# Patient Record
Sex: Male | Born: 1945 | Race: White | Hispanic: No | Marital: Married | Smoking: Current some day smoker
Health system: Southern US, Community
[De-identification: ages and names within clinical notes are randomized; demographics above are authoritative.]

---

## 2003-01-03 ENCOUNTER — Ambulatory Visit (HOSPITAL_COMMUNITY): Admission: RE | Admit: 2003-01-03 | Discharge: 2003-01-03 | Payer: Self-pay | Admitting: Gastroenterology

## 2008-07-08 ENCOUNTER — Encounter: Admission: RE | Admit: 2008-07-08 | Discharge: 2008-07-08 | Payer: Self-pay | Admitting: Family Medicine

## 2011-03-15 NOTE — Op Note (Signed)
NAME:  Brady Castaneda, Brady Castaneda                          ACCOUNT NO.:  [id number]   MEDICAL RECORD NO.:  [id number]                   PATIENT TYPE:  AMB   LOCATION:  ENDO                                 FACILITY:  [facility]   PHYSICIAN:  Anselmo Rod, M.D.               DATE OF BIRTH:  [birth year]   DATE OF PROCEDURE:  01/03/2003  DATE OF DISCHARGE:                                 OPERATIVE REPORT   PROCEDURE PERFORMED:  Screening colonoscopy.   ENDOSCOPIST:  Charna Elizabeth, M.D.   INSTRUMENT USED:  Olympus video colonoscope.   INDICATIONS FOR PROCEDURE:  The patient is a 65 year old white male  undergoing screening colonoscopy to rule out colonic polyps, masses, etc.   PREPROCEDURE PREPARATION:  Informed consent was procured from the patient.  The patient was fasted for eight hours prior to the procedure and prepped  with a bottle of magnesium citrate and a gallon of GoLYTELY the night prior  to the procedure.   PREPROCEDURE PHYSICAL:  The patient had stable vital signs.  Neck supple.  Chest clear to auscultation.  S1 and S2 regular.  Abdomen soft with normal  bowel sounds.   DESCRIPTION OF PROCEDURE:  The patient was placed in left lateral decubitus  position and sedated with 90 mg of Demerol and 8 mg of Versed intravenously.  Once the patient was adequately sedated and maintained on low flow oxygen  and continuous cardiac monitoring, the Olympus video colonoscope was  advanced from the rectum to the cecum and terminal ileum without difficulty.  The patient had a good prep.  There was scattered early diverticular disease  throughout the colon.  No masses, polyps, erosions, ulcerations, etc. were  seen.  Small internal hemorrhoids were seen on retroflexion.  The terminal  ileum appeared normal and without lesions.  The appendicular orifice and  ileocecal valve were clearly visualized and photographed.   IMPRESSION:  1. 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  2.  Small nonbleeding internal hemorrhoids.  3. No masses or polyps seen.  4. Normal-appearing terminal ileum.   RECOMMENDATIONS:  1. A high fiber diet has been discussed with the patient in great detail and     brochures on diverticular disease have been given to him for his     education.  20 to 25 gm of fiber in the diet has been recommended on a     daily basis.  2.     Repeat colorectal cancer screening has been recommended in the next 10 years     unless the patient develops any abnormal symptoms in the interim.  3. Outpatient follow-up on a p.r.n. basis.  Anselmo Rod, M.D.    JNM/MEDQ  D:  01/03/2003  T:  01/03/2003  Job:  [id number]   cc:   Talmadge Coventry, M.D.  [address]  [place]  [place] [address]  Fax: [phone]

## 2014-10-25 ENCOUNTER — Ambulatory Visit (INDEPENDENT_AMBULATORY_CARE_PROVIDER_SITE_OTHER): Payer: Medicare Other | Admitting: Family Medicine

## 2014-10-25 VITALS — BP 136/88 | HR 66 | Temp 98.1°F | Resp 16 | Ht 73.0 in | Wt 278.6 lb

## 2014-10-25 DIAGNOSIS — H109 Unspecified conjunctivitis: Secondary | ICD-10-CM | POA: Diagnosis not present

## 2014-10-25 DIAGNOSIS — J069 Acute upper respiratory infection, unspecified: Secondary | ICD-10-CM

## 2014-10-25 MED ORDER — CIPROFLOXACIN HCL 0.3 % OP SOLN: OPHTHALMIC | Status: AC

## 2014-10-25 MED ORDER — AZELASTINE HCL 0.05 % OP SOLN: 1.0000 [drp] | Freq: Two times a day (BID) | OPHTHALMIC | Status: AC | PRN

## 2014-10-25 NOTE — Progress Notes (Signed)
Subjective:  This chart was scribed for Merri Ray, MD by Tula Nakayama, ED Scribe. This patient was seen at [facility] and the patient's care was started at 2:34 PM   Patient ID: Brady Castaneda, male    DOB: August 25, 1946, 68 y.o.   MRN: [id number]  HPI  Brady Castaneda is a 68 y.o. male No primary care provider on file.  HPI Comments: Brady Castaneda is a 68 y.o. male who presents to [facility] complaining of constant congestion, rhinorrhea and cough that started 3 days ago. He states erythema, watering and crusting of both eyes that started 2 days ago as associated symptoms. He reports symptoms are worse on the right side. Pt notes that eyes feel itchy and grainy, but are not painful. He has tried Systane with no relief. Pt states that discharge is worse at night. He denies a history of allergies and recent eye injuries. Pt also denies visual disturbances and fever as associated symptoms.  Review of Systems  Constitutional: Negative for fever.  HENT: Positive for congestion and rhinorrhea.   Eyes: Positive for redness and itching. Negative for visual disturbance.  Respiratory: Positive for cough.   All other systems reviewed and are negative.     Objective:   Physical Exam  Constitutional: He is oriented to person, place, and time. He appears well-developed and well-nourished.  HENT:  Head: Normocephalic and atraumatic.  Right Ear: Tympanic membrane, external ear and ear canal normal.  Left Ear: Tympanic membrane, external ear and ear canal normal.  Nose: No rhinorrhea.  Mouth/Throat: Oropharynx is clear and moist and mucous membranes are normal. No oropharyngeal exudate or posterior oropharyngeal erythema.  Eyes: Conjunctivae and EOM are normal. Pupils are equal, round, and reactive to light. Right eye exhibits no discharge. Left eye exhibits no discharge.  PERRL; EOMI; anterior chambers are clear; diffuse scleral injection bilaterally; right greater than left; no crusting or discharge  at canthi; no preauricular lymphadenopathy; no other lymphadenopathy  Neck: Neck supple.  Cardiovascular: Normal rate, regular rhythm, normal heart sounds and intact distal pulses.   No murmur heard. Pulmonary/Chest: Effort normal and breath sounds normal. He has no wheezes. He has no rhonchi. He has no rales.  Abdominal: Soft. There is no tenderness.  Lymphadenopathy:    He has no cervical adenopathy.  Neurological: He is alert and oriented to person, place, and time.  Skin: Skin is warm and dry. No rash noted.  Psychiatric: He has a normal mood and affect. His behavior is normal.  Nursing note and vitals reviewed.  Filed Vitals:   10/25/14 1323  BP: 136/88  Pulse: 66  Temp: 98.1 F (36.7 C)  TempSrc: Oral  Resp: 16  Height: 6\' 1"  (1.854 m)  Weight: 278 lb 9.6 oz (126.372 kg)  SpO2: 97%    Visual Acuity Screening   Right eye Left eye Both eyes  Without correction: 20/20 20/20 20/20   With correction:         Assessment & Plan:   Brady Castaneda is a 68 y.o. male Bilateral conjunctivitis - Plan: azelastine (OPTIVAR) 0.05 % ophthalmic solution  Acute upper respiratory infection - Plan: ciprofloxacin (CILOXAN) 0.3 % ophthalmic solution  Suspected viral conjunctivitis, with underlying URI. May be allergic component with bilateral nature and itching - trial of Optivar gtts. If not improving, or daytime discharge/crusting - can fill antibiotic gtts. rtc precautions discussed - especially if any change in visual acuity.   Meds ordered this encounter  Medications  . azelastine (  OPTIVAR) 0.05 % ophthalmic solution    Sig: Place 1 drop into both eyes 2 (two) times daily as needed.    Dispense:  6 mL    Refill:  1  . ciprofloxacin (CILOXAN) 0.3 % ophthalmic solution    Sig: Administer 1 drop to affected eye every 2 hours, while awake, for 2 days. Then 1 drop, every 4 hours, while awake, for the next 5 days.    Dispense:  10 mL    Refill:  0   Patient Instructions  Your "pink  eye" is likely due to the cold virus, but with itching symptoms - can try prescription eye drop to see if this helps. If crusting or discharge noted during the day, can treat for bacteria with printed prescription. Return to the clinic or go to the nearest emergency room if any of your symptoms worsen or new symptoms occur.    Conjunctivitis Conjunctivitis is commonly called "pink eye." Conjunctivitis can be caused by bacterial or viral infection, allergies, or injuries. There is usually redness of the lining of the eye, itching, discomfort, and sometimes discharge. There may be deposits of matter along the eyelids. A viral infection usually causes a watery discharge, while a bacterial infection causes a yellowish, thick discharge. Pink eye is very contagious and spreads by direct contact. You may be given antibiotic eyedrops as part of your treatment. Before using your eye medicine, remove all drainage from the eye by washing gently with warm water and cotton balls. Continue to use the medication until you have awakened 2 mornings in a row without discharge from the eye. Do not rub your eye. This increases the irritation and helps spread infection. Use separate towels from other household members. Wash your hands with soap and water before and after touching your eyes. Use cold compresses to reduce pain and sunglasses to relieve irritation from light. Do not wear contact lenses or wear eye makeup until the infection is gone. SEEK MEDICAL CARE IF:   Your symptoms are not better after 3 days of treatment.  You have increased pain or trouble seeing.  The outer eyelids become very red or swollen. Document Released: 11/21/2004 Document Revised: 01/06/2012 Document Reviewed: 10/14/2005 [facility] Patient Information 2015 [place], [place]. This information is not intended to replace advice given to you by your health care provider. Make sure you discuss any questions you have with your health care  provider.     I personally performed the services described in this documentation, which was scribed in my presence. The recorded information has been reviewed and considered, and addended by me as needed.

## 2014-10-25 NOTE — Patient Instructions (Signed)
Your "pink eye" is likely due to the cold virus, but with itching symptoms - can try prescription eye drop to see if this helps. If crusting or discharge noted during the day, can treat for bacteria with printed prescription. Return to the clinic or go to the nearest emergency room if any of your symptoms worsen or new symptoms occur.    Conjunctivitis Conjunctivitis is commonly called "pink eye." Conjunctivitis can be caused by bacterial or viral infection, allergies, or injuries. There is usually redness of the lining of the eye, itching, discomfort, and sometimes discharge. There may be deposits of matter along the eyelids. A viral infection usually causes a watery discharge, while a bacterial infection causes a yellowish, thick discharge. Pink eye is very contagious and spreads by direct contact. You may be given antibiotic eyedrops as part of your treatment. Before using your eye medicine, remove all drainage from the eye by washing gently with warm water and cotton balls. Continue to use the medication until you have awakened 2 mornings in a row without discharge from the eye. Do not rub your eye. This increases the irritation and helps spread infection. Use separate towels from other household members. Wash your hands with soap and water before and after touching your eyes. Use cold compresses to reduce pain and sunglasses to relieve irritation from light. Do not wear contact lenses or wear eye makeup until the infection is gone. SEEK MEDICAL CARE IF:   Your symptoms are not better after 3 days of treatment.  You have increased pain or trouble seeing.  The outer eyelids become very red or swollen. Document Released: 11/21/2004 Document Revised: 01/06/2012 Document Reviewed: 10/14/2005 [facility] Patient Information 2015 [place], [place]. This information is not intended to replace advice given to you by your health care provider. Make sure you discuss any questions you have with your health care  provider.

## 2015-12-25 DIAGNOSIS — I10 Essential (primary) hypertension: Secondary | ICD-10-CM | POA: Diagnosis not present

## 2015-12-25 DIAGNOSIS — Z131 Encounter for screening for diabetes mellitus: Secondary | ICD-10-CM | POA: Diagnosis not present

## 2015-12-25 DIAGNOSIS — Z125 Encounter for screening for malignant neoplasm of prostate: Secondary | ICD-10-CM | POA: Diagnosis not present

## 2015-12-25 DIAGNOSIS — Z13220 Encounter for screening for lipoid disorders: Secondary | ICD-10-CM | POA: Diagnosis not present

## 2015-12-25 DIAGNOSIS — R197 Diarrhea, unspecified: Secondary | ICD-10-CM | POA: Diagnosis not present

## 2015-12-25 DIAGNOSIS — E669 Obesity, unspecified: Secondary | ICD-10-CM | POA: Diagnosis not present

## 2015-12-25 DIAGNOSIS — Z23 Encounter for immunization: Secondary | ICD-10-CM | POA: Diagnosis not present

## 2015-12-25 DIAGNOSIS — Z1211 Encounter for screening for malignant neoplasm of colon: Secondary | ICD-10-CM | POA: Diagnosis not present

## 2015-12-26 DIAGNOSIS — Z23 Encounter for immunization: Secondary | ICD-10-CM | POA: Diagnosis not present

## 2015-12-26 DIAGNOSIS — Z136 Encounter for screening for cardiovascular disorders: Secondary | ICD-10-CM | POA: Diagnosis not present

## 2015-12-26 DIAGNOSIS — Z131 Encounter for screening for diabetes mellitus: Secondary | ICD-10-CM | POA: Diagnosis not present

## 2015-12-26 DIAGNOSIS — I10 Essential (primary) hypertension: Secondary | ICD-10-CM | POA: Diagnosis not present

## 2015-12-26 DIAGNOSIS — Z125 Encounter for screening for malignant neoplasm of prostate: Secondary | ICD-10-CM | POA: Diagnosis not present

## 2015-12-26 DIAGNOSIS — E669 Obesity, unspecified: Secondary | ICD-10-CM | POA: Diagnosis not present

## 2016-01-04 DIAGNOSIS — K439 Ventral hernia without obstruction or gangrene: Secondary | ICD-10-CM | POA: Diagnosis not present

## 2016-01-04 DIAGNOSIS — Z1211 Encounter for screening for malignant neoplasm of colon: Secondary | ICD-10-CM | POA: Diagnosis not present

## 2016-01-04 DIAGNOSIS — K5730 Diverticulosis of large intestine without perforation or abscess without bleeding: Secondary | ICD-10-CM | POA: Diagnosis not present

## 2016-01-08 DIAGNOSIS — I10 Essential (primary) hypertension: Secondary | ICD-10-CM | POA: Diagnosis not present

## 2016-01-08 DIAGNOSIS — F430 Acute stress reaction: Secondary | ICD-10-CM | POA: Diagnosis not present

## 2016-01-08 DIAGNOSIS — E669 Obesity, unspecified: Secondary | ICD-10-CM | POA: Diagnosis not present

## 2016-02-05 DIAGNOSIS — I10 Essential (primary) hypertension: Secondary | ICD-10-CM | POA: Diagnosis not present

## 2016-02-05 DIAGNOSIS — E669 Obesity, unspecified: Secondary | ICD-10-CM | POA: Diagnosis not present

## 2016-02-05 DIAGNOSIS — F430 Acute stress reaction: Secondary | ICD-10-CM | POA: Diagnosis not present

## 2016-02-23 DIAGNOSIS — H6122 Impacted cerumen, left ear: Secondary | ICD-10-CM | POA: Diagnosis not present

## 2016-02-23 DIAGNOSIS — E669 Obesity, unspecified: Secondary | ICD-10-CM | POA: Diagnosis not present

## 2016-02-23 DIAGNOSIS — I10 Essential (primary) hypertension: Secondary | ICD-10-CM | POA: Diagnosis not present

## 2016-02-23 DIAGNOSIS — E7800 Pure hypercholesterolemia, unspecified: Secondary | ICD-10-CM | POA: Diagnosis not present

## 2016-06-07 DIAGNOSIS — K621 Rectal polyp: Secondary | ICD-10-CM | POA: Diagnosis not present

## 2016-06-07 DIAGNOSIS — D128 Benign neoplasm of rectum: Secondary | ICD-10-CM | POA: Diagnosis not present

## 2016-06-07 DIAGNOSIS — D123 Benign neoplasm of transverse colon: Secondary | ICD-10-CM | POA: Diagnosis not present

## 2016-06-07 DIAGNOSIS — Z1211 Encounter for screening for malignant neoplasm of colon: Secondary | ICD-10-CM | POA: Diagnosis not present

## 2016-06-07 DIAGNOSIS — K635 Polyp of colon: Secondary | ICD-10-CM | POA: Diagnosis not present

## 2016-06-27 DIAGNOSIS — E7800 Pure hypercholesterolemia, unspecified: Secondary | ICD-10-CM | POA: Diagnosis not present

## 2016-06-27 DIAGNOSIS — R739 Hyperglycemia, unspecified: Secondary | ICD-10-CM | POA: Diagnosis not present

## 2016-06-27 DIAGNOSIS — I10 Essential (primary) hypertension: Secondary | ICD-10-CM | POA: Diagnosis not present

## 2016-06-27 DIAGNOSIS — Z6836 Body mass index (BMI) 36.0-36.9, adult: Secondary | ICD-10-CM | POA: Diagnosis not present

## 2016-06-27 DIAGNOSIS — E669 Obesity, unspecified: Secondary | ICD-10-CM | POA: Diagnosis not present

## 2016-08-27 DIAGNOSIS — E669 Obesity, unspecified: Secondary | ICD-10-CM | POA: Diagnosis not present

## 2016-08-27 DIAGNOSIS — E7800 Pure hypercholesterolemia, unspecified: Secondary | ICD-10-CM | POA: Diagnosis not present

## 2016-08-27 DIAGNOSIS — R739 Hyperglycemia, unspecified: Secondary | ICD-10-CM | POA: Diagnosis not present

## 2016-08-27 DIAGNOSIS — I10 Essential (primary) hypertension: Secondary | ICD-10-CM | POA: Diagnosis not present

## 2016-08-27 DIAGNOSIS — Z6837 Body mass index (BMI) 37.0-37.9, adult: Secondary | ICD-10-CM | POA: Diagnosis not present

## 2016-10-29 DIAGNOSIS — R739 Hyperglycemia, unspecified: Secondary | ICD-10-CM | POA: Diagnosis not present

## 2016-10-29 DIAGNOSIS — I10 Essential (primary) hypertension: Secondary | ICD-10-CM | POA: Diagnosis not present

## 2016-10-29 DIAGNOSIS — Z6837 Body mass index (BMI) 37.0-37.9, adult: Secondary | ICD-10-CM | POA: Diagnosis not present

## 2016-10-29 DIAGNOSIS — E7800 Pure hypercholesterolemia, unspecified: Secondary | ICD-10-CM | POA: Diagnosis not present

## 2016-10-29 DIAGNOSIS — E669 Obesity, unspecified: Secondary | ICD-10-CM | POA: Diagnosis not present

## 2016-12-10 DIAGNOSIS — I10 Essential (primary) hypertension: Secondary | ICD-10-CM | POA: Diagnosis not present

## 2016-12-10 DIAGNOSIS — E669 Obesity, unspecified: Secondary | ICD-10-CM | POA: Diagnosis not present

## 2016-12-10 DIAGNOSIS — R739 Hyperglycemia, unspecified: Secondary | ICD-10-CM | POA: Diagnosis not present

## 2016-12-10 DIAGNOSIS — L989 Disorder of the skin and subcutaneous tissue, unspecified: Secondary | ICD-10-CM | POA: Diagnosis not present

## 2016-12-10 DIAGNOSIS — Z6836 Body mass index (BMI) 36.0-36.9, adult: Secondary | ICD-10-CM | POA: Diagnosis not present

## 2016-12-10 DIAGNOSIS — E7800 Pure hypercholesterolemia, unspecified: Secondary | ICD-10-CM | POA: Diagnosis not present

## 2017-01-23 DIAGNOSIS — E7800 Pure hypercholesterolemia, unspecified: Secondary | ICD-10-CM | POA: Diagnosis not present

## 2017-01-23 DIAGNOSIS — R740 Nonspecific elevation of levels of transaminase and lactic acid dehydrogenase [LDH]: Secondary | ICD-10-CM | POA: Diagnosis not present

## 2017-01-23 DIAGNOSIS — E669 Obesity, unspecified: Secondary | ICD-10-CM | POA: Diagnosis not present

## 2017-01-23 DIAGNOSIS — Z6836 Body mass index (BMI) 36.0-36.9, adult: Secondary | ICD-10-CM | POA: Diagnosis not present

## 2017-01-23 DIAGNOSIS — I10 Essential (primary) hypertension: Secondary | ICD-10-CM | POA: Diagnosis not present

## 2017-01-23 DIAGNOSIS — R739 Hyperglycemia, unspecified: Secondary | ICD-10-CM | POA: Diagnosis not present

## 2017-02-03 ENCOUNTER — Other Ambulatory Visit: Payer: Self-pay | Admitting: Family Medicine

## 2017-02-03 DIAGNOSIS — Z6837 Body mass index (BMI) 37.0-37.9, adult: Secondary | ICD-10-CM | POA: Diagnosis not present

## 2017-02-03 DIAGNOSIS — E669 Obesity, unspecified: Secondary | ICD-10-CM | POA: Diagnosis not present

## 2017-02-03 DIAGNOSIS — R718 Other abnormality of red blood cells: Secondary | ICD-10-CM | POA: Diagnosis not present

## 2017-02-03 DIAGNOSIS — R740 Nonspecific elevation of levels of transaminase and lactic acid dehydrogenase [LDH]: Principal | ICD-10-CM

## 2017-02-03 DIAGNOSIS — R899 Unspecified abnormal finding in specimens from other organs, systems and tissues: Secondary | ICD-10-CM | POA: Diagnosis not present

## 2017-02-03 DIAGNOSIS — R7401 Elevation of levels of liver transaminase levels: Secondary | ICD-10-CM

## 2017-02-05 ENCOUNTER — Other Ambulatory Visit: Payer: Self-pay | Admitting: Family Medicine

## 2017-02-05 DIAGNOSIS — R7401 Elevation of levels of liver transaminase levels: Secondary | ICD-10-CM

## 2017-02-05 DIAGNOSIS — R740 Nonspecific elevation of levels of transaminase and lactic acid dehydrogenase [LDH]: Principal | ICD-10-CM

## 2017-02-10 ENCOUNTER — Ambulatory Visit
Admission: RE | Admit: 2017-02-10 | Discharge: 2017-02-10 | Disposition: A | Discharge status: Home / Self Care | Payer: Medicare Other | Source: Ambulatory Visit | Attending: Family Medicine | Admitting: Family Medicine

## 2017-02-10 DIAGNOSIS — R740 Nonspecific elevation of levels of transaminase and lactic acid dehydrogenase [LDH]: Principal | ICD-10-CM

## 2017-02-10 DIAGNOSIS — R7401 Elevation of levels of liver transaminase levels: Secondary | ICD-10-CM

## 2017-02-10 DIAGNOSIS — K760 Fatty (change of) liver, not elsewhere classified: Secondary | ICD-10-CM | POA: Diagnosis not present

## 2017-03-05 DIAGNOSIS — M25561 Pain in right knee: Secondary | ICD-10-CM | POA: Diagnosis not present

## 2017-03-10 DIAGNOSIS — M25561 Pain in right knee: Secondary | ICD-10-CM | POA: Diagnosis not present

## 2017-03-28 DIAGNOSIS — L237 Allergic contact dermatitis due to plants, except food: Secondary | ICD-10-CM | POA: Diagnosis not present

## 2017-03-31 DIAGNOSIS — E7800 Pure hypercholesterolemia, unspecified: Secondary | ICD-10-CM | POA: Diagnosis not present

## 2017-03-31 DIAGNOSIS — I10 Essential (primary) hypertension: Secondary | ICD-10-CM | POA: Diagnosis not present

## 2017-03-31 DIAGNOSIS — K760 Fatty (change of) liver, not elsewhere classified: Secondary | ICD-10-CM | POA: Diagnosis not present

## 2017-03-31 DIAGNOSIS — R739 Hyperglycemia, unspecified: Secondary | ICD-10-CM | POA: Diagnosis not present

## 2017-03-31 DIAGNOSIS — R740 Nonspecific elevation of levels of transaminase and lactic acid dehydrogenase [LDH]: Secondary | ICD-10-CM | POA: Diagnosis not present

## 2017-03-31 DIAGNOSIS — E669 Obesity, unspecified: Secondary | ICD-10-CM | POA: Diagnosis not present

## 2017-03-31 DIAGNOSIS — Z6836 Body mass index (BMI) 36.0-36.9, adult: Secondary | ICD-10-CM | POA: Diagnosis not present

## 2017-07-21 DIAGNOSIS — M1711 Unilateral primary osteoarthritis, right knee: Secondary | ICD-10-CM | POA: Diagnosis not present

## 2017-07-21 DIAGNOSIS — Z6837 Body mass index (BMI) 37.0-37.9, adult: Secondary | ICD-10-CM | POA: Diagnosis not present

## 2017-07-21 DIAGNOSIS — I10 Essential (primary) hypertension: Secondary | ICD-10-CM | POA: Diagnosis not present

## 2017-07-21 DIAGNOSIS — Z23 Encounter for immunization: Secondary | ICD-10-CM | POA: Diagnosis not present

## 2017-07-21 DIAGNOSIS — E669 Obesity, unspecified: Secondary | ICD-10-CM | POA: Diagnosis not present

## 2017-07-21 DIAGNOSIS — R740 Nonspecific elevation of levels of transaminase and lactic acid dehydrogenase [LDH]: Secondary | ICD-10-CM | POA: Diagnosis not present

## 2017-07-21 DIAGNOSIS — K760 Fatty (change of) liver, not elsewhere classified: Secondary | ICD-10-CM | POA: Diagnosis not present

## 2017-07-21 DIAGNOSIS — E7800 Pure hypercholesterolemia, unspecified: Secondary | ICD-10-CM | POA: Diagnosis not present

## 2017-12-08 DIAGNOSIS — R739 Hyperglycemia, unspecified: Secondary | ICD-10-CM | POA: Diagnosis not present

## 2017-12-08 DIAGNOSIS — R740 Nonspecific elevation of levels of transaminase and lactic acid dehydrogenase [LDH]: Secondary | ICD-10-CM | POA: Diagnosis not present

## 2017-12-08 DIAGNOSIS — K760 Fatty (change of) liver, not elsewhere classified: Secondary | ICD-10-CM | POA: Diagnosis not present

## 2017-12-08 DIAGNOSIS — Z23 Encounter for immunization: Secondary | ICD-10-CM | POA: Diagnosis not present

## 2017-12-08 DIAGNOSIS — E7800 Pure hypercholesterolemia, unspecified: Secondary | ICD-10-CM | POA: Diagnosis not present

## 2017-12-08 DIAGNOSIS — I10 Essential (primary) hypertension: Secondary | ICD-10-CM | POA: Diagnosis not present

## 2017-12-08 DIAGNOSIS — Z6837 Body mass index (BMI) 37.0-37.9, adult: Secondary | ICD-10-CM | POA: Diagnosis not present

## 2017-12-08 DIAGNOSIS — E669 Obesity, unspecified: Secondary | ICD-10-CM | POA: Diagnosis not present

## 2017-12-11 DIAGNOSIS — E7800 Pure hypercholesterolemia, unspecified: Secondary | ICD-10-CM | POA: Diagnosis not present

## 2017-12-11 DIAGNOSIS — Z6837 Body mass index (BMI) 37.0-37.9, adult: Secondary | ICD-10-CM | POA: Diagnosis not present

## 2017-12-11 DIAGNOSIS — I10 Essential (primary) hypertension: Secondary | ICD-10-CM | POA: Diagnosis not present

## 2017-12-11 DIAGNOSIS — K760 Fatty (change of) liver, not elsewhere classified: Secondary | ICD-10-CM | POA: Diagnosis not present

## 2017-12-11 DIAGNOSIS — R739 Hyperglycemia, unspecified: Secondary | ICD-10-CM | POA: Diagnosis not present

## 2017-12-11 DIAGNOSIS — R740 Nonspecific elevation of levels of transaminase and lactic acid dehydrogenase [LDH]: Secondary | ICD-10-CM | POA: Diagnosis not present

## 2017-12-11 DIAGNOSIS — E669 Obesity, unspecified: Secondary | ICD-10-CM | POA: Diagnosis not present

## 2018-02-19 DIAGNOSIS — I10 Essential (primary) hypertension: Secondary | ICD-10-CM | POA: Diagnosis not present

## 2018-02-19 DIAGNOSIS — R739 Hyperglycemia, unspecified: Secondary | ICD-10-CM | POA: Diagnosis not present

## 2018-02-19 DIAGNOSIS — K760 Fatty (change of) liver, not elsewhere classified: Secondary | ICD-10-CM | POA: Diagnosis not present

## 2018-02-19 DIAGNOSIS — Z6836 Body mass index (BMI) 36.0-36.9, adult: Secondary | ICD-10-CM | POA: Diagnosis not present

## 2018-02-19 DIAGNOSIS — R740 Nonspecific elevation of levels of transaminase and lactic acid dehydrogenase [LDH]: Secondary | ICD-10-CM | POA: Diagnosis not present

## 2018-02-19 DIAGNOSIS — E669 Obesity, unspecified: Secondary | ICD-10-CM | POA: Diagnosis not present

## 2018-02-19 DIAGNOSIS — E7800 Pure hypercholesterolemia, unspecified: Secondary | ICD-10-CM | POA: Diagnosis not present

## 2018-04-09 DIAGNOSIS — R739 Hyperglycemia, unspecified: Secondary | ICD-10-CM | POA: Diagnosis not present

## 2018-04-09 DIAGNOSIS — E7800 Pure hypercholesterolemia, unspecified: Secondary | ICD-10-CM | POA: Diagnosis not present

## 2018-04-09 DIAGNOSIS — E669 Obesity, unspecified: Secondary | ICD-10-CM | POA: Diagnosis not present

## 2018-04-09 DIAGNOSIS — K760 Fatty (change of) liver, not elsewhere classified: Secondary | ICD-10-CM | POA: Diagnosis not present

## 2018-04-09 DIAGNOSIS — R740 Nonspecific elevation of levels of transaminase and lactic acid dehydrogenase [LDH]: Secondary | ICD-10-CM | POA: Diagnosis not present

## 2018-04-09 DIAGNOSIS — I10 Essential (primary) hypertension: Secondary | ICD-10-CM | POA: Diagnosis not present

## 2018-07-01 IMAGING — US US ABDOMEN LIMITED
1 series · 14 of 25 positions shown · non-contrast
Comparison: Ultrasound 07/08/2008

CLINICAL DATA: Transaminitis

EXAM:
US ABDOMEN LIMITED - RIGHT UPPER QUADRANT

[Series 1: us abdomen limited · 0.28mm/px · 14 of 45 slices shown]
[im 1/45]
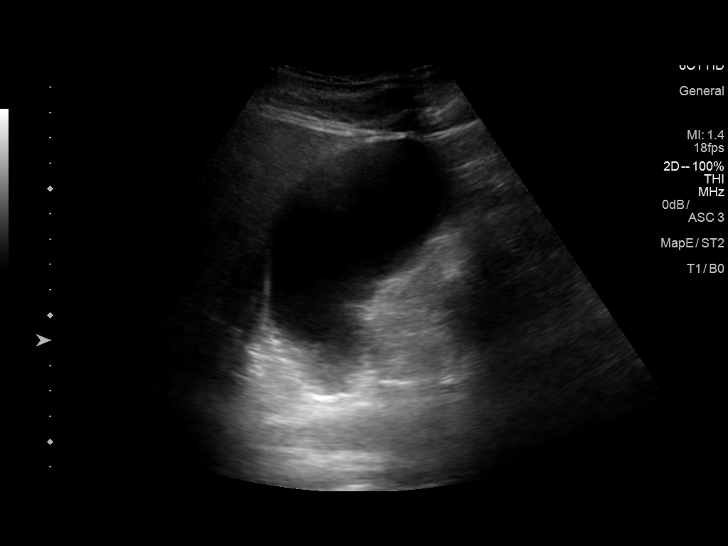
[im 4/45]
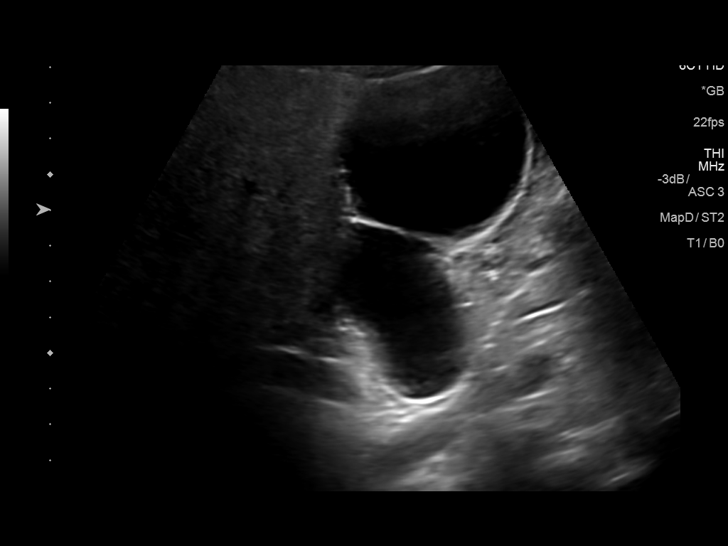
[im 8/45]
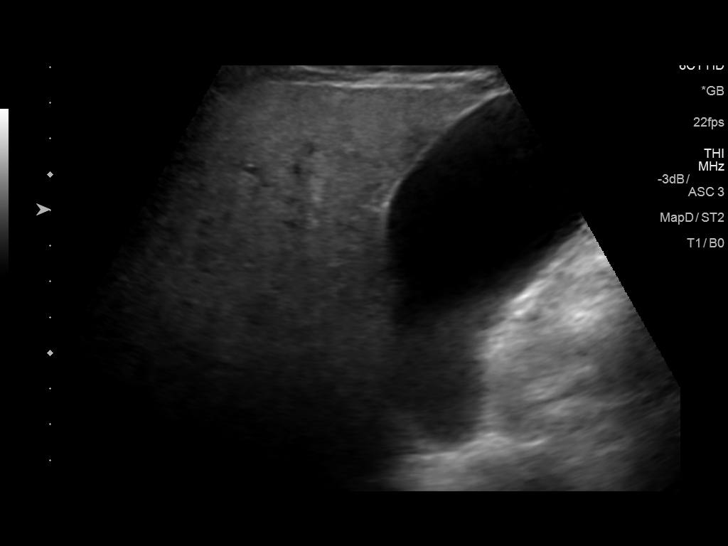
[im 12/45]
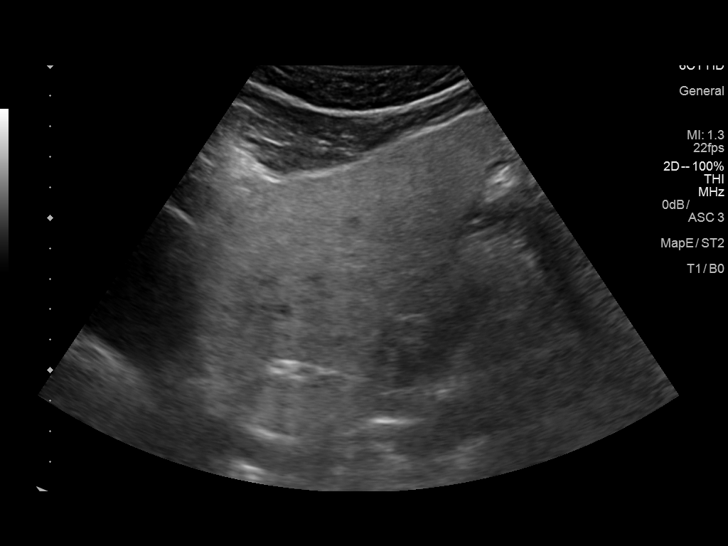
[im 15/45]
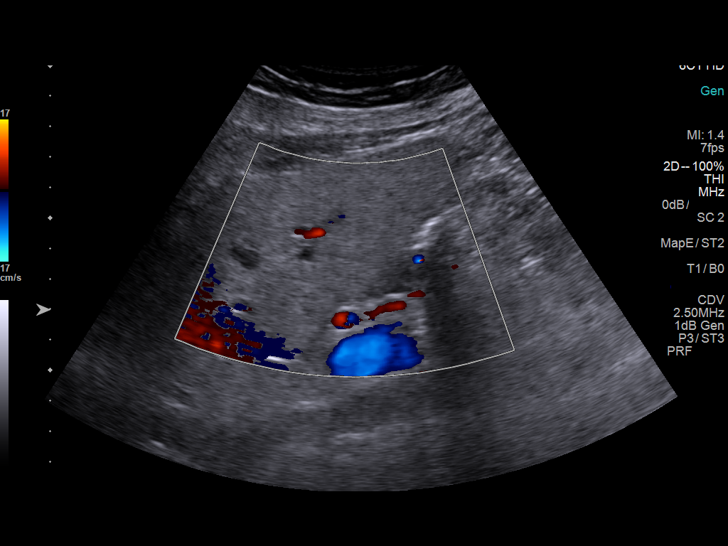
[im 17/45]
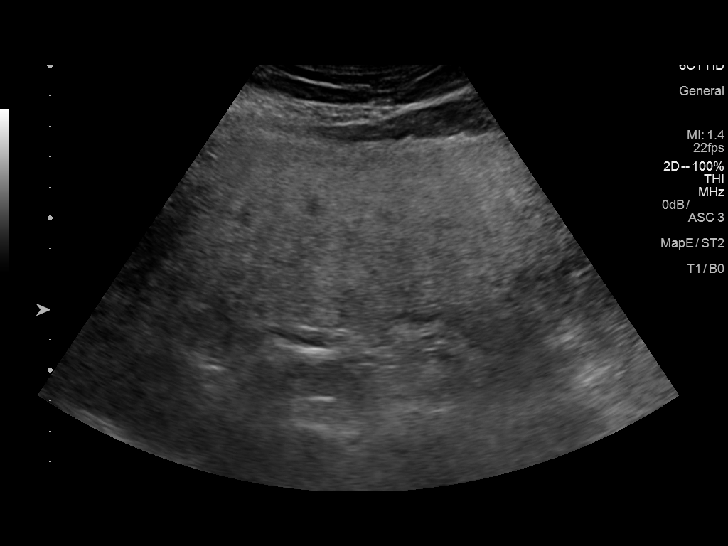
[im 21/45]
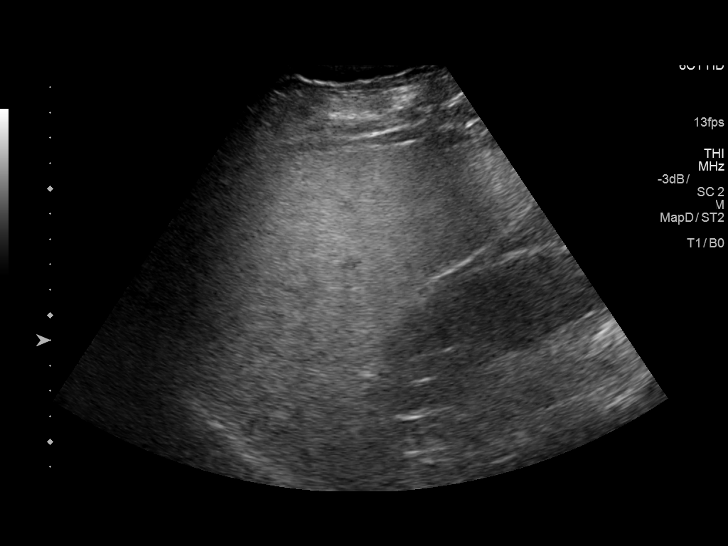
[im 24/45]
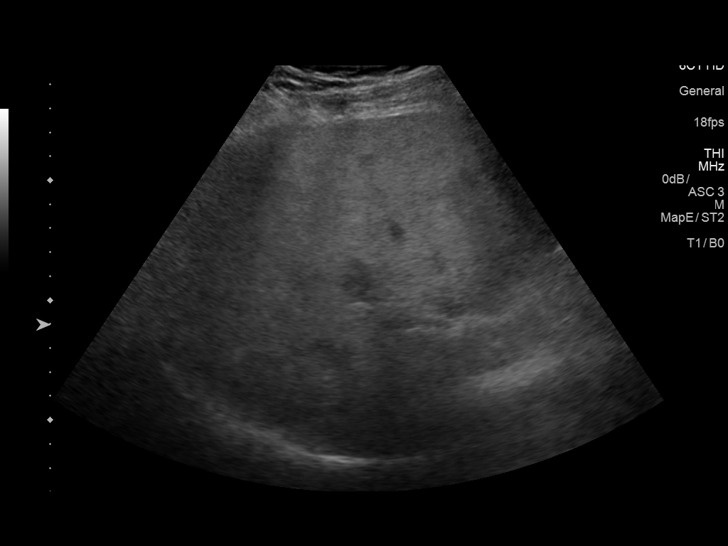
[im 28/45]
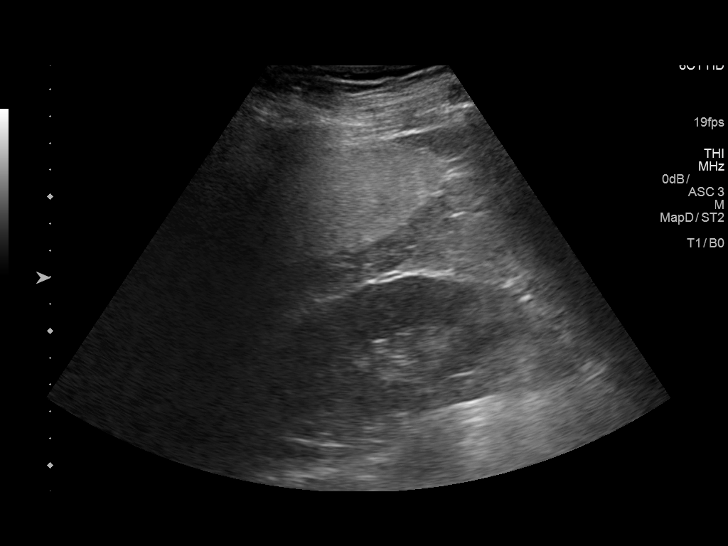
[im 30/45]
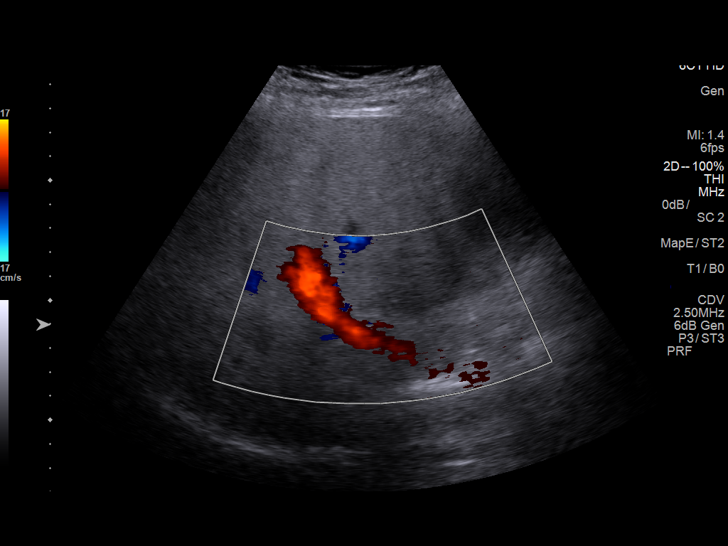
[im 34/45]
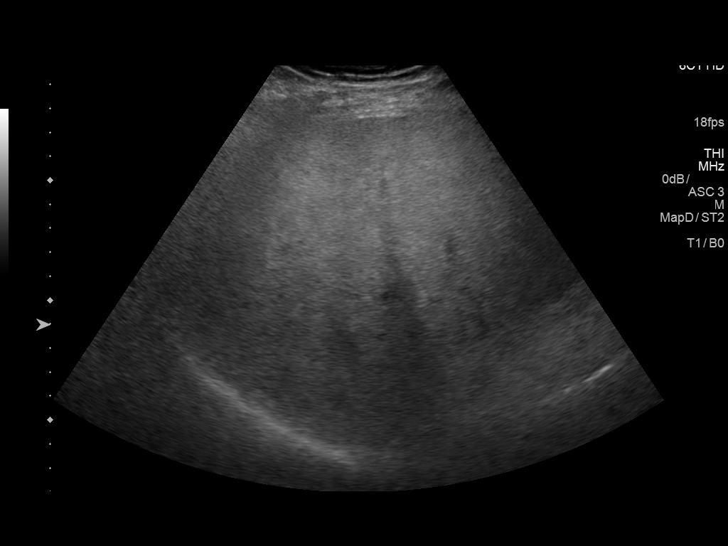
[im 37/45]
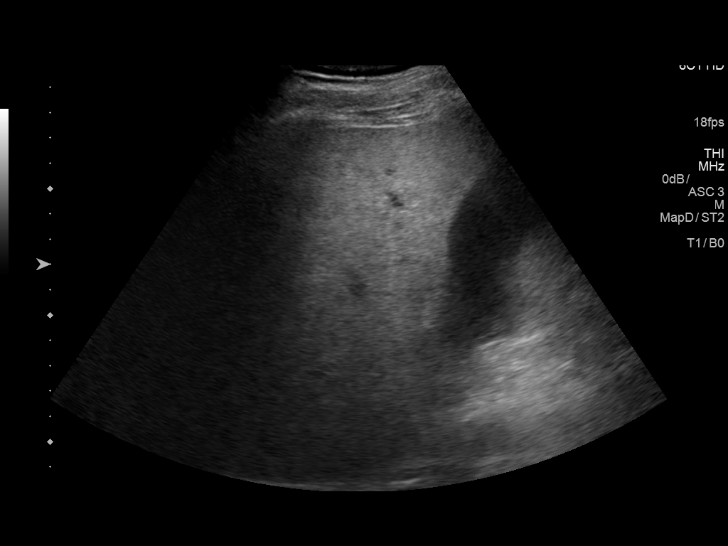
[im 41/45]
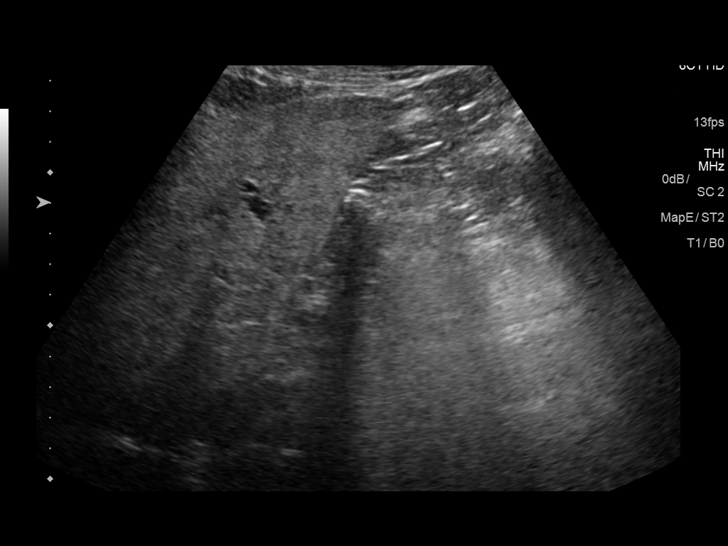
[im 45/45]
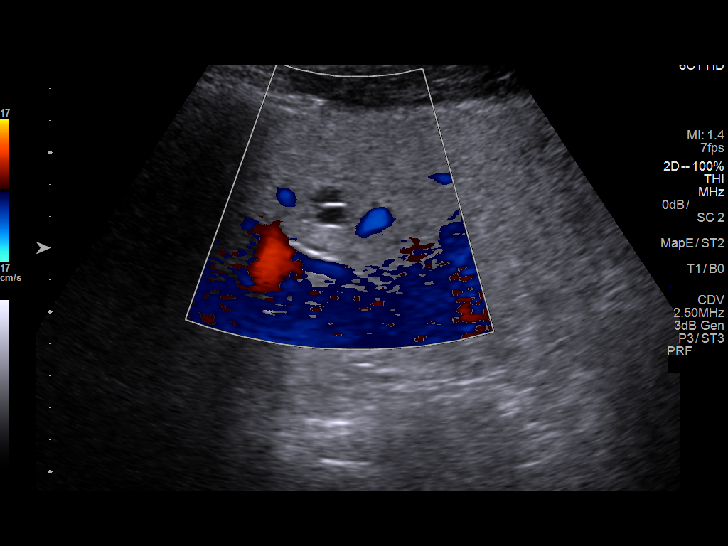

[14 of 25 positions shown; findings below may reference images not displayed]

FINDINGS: Gallbladder:

No gallstones or wall thickening visualized. No sonographic Murphy
sign noted by sonographer.

Common bile duct:

Diameter: 4.2 mm

Liver:

Liver is diffusely hyperechoic compatible with fatty infiltration.
Several small cysts in the liver. These measure under 1 cm.
IMPRESSION: Negative for gallstones

Hyperechoic liver compatible with fatty infiltration.

## 2018-07-09 DIAGNOSIS — R739 Hyperglycemia, unspecified: Secondary | ICD-10-CM | POA: Diagnosis not present

## 2018-07-09 DIAGNOSIS — I10 Essential (primary) hypertension: Secondary | ICD-10-CM | POA: Diagnosis not present

## 2018-07-09 DIAGNOSIS — R740 Nonspecific elevation of levels of transaminase and lactic acid dehydrogenase [LDH]: Secondary | ICD-10-CM | POA: Diagnosis not present

## 2018-07-09 DIAGNOSIS — Z23 Encounter for immunization: Secondary | ICD-10-CM | POA: Diagnosis not present

## 2018-07-09 DIAGNOSIS — K760 Fatty (change of) liver, not elsewhere classified: Secondary | ICD-10-CM | POA: Diagnosis not present

## 2018-07-09 DIAGNOSIS — E669 Obesity, unspecified: Secondary | ICD-10-CM | POA: Diagnosis not present

## 2018-07-09 DIAGNOSIS — E7800 Pure hypercholesterolemia, unspecified: Secondary | ICD-10-CM | POA: Diagnosis not present

## 2018-11-10 DIAGNOSIS — Z6837 Body mass index (BMI) 37.0-37.9, adult: Secondary | ICD-10-CM | POA: Diagnosis not present

## 2018-11-10 DIAGNOSIS — E669 Obesity, unspecified: Secondary | ICD-10-CM | POA: Diagnosis not present

## 2018-11-10 DIAGNOSIS — E7800 Pure hypercholesterolemia, unspecified: Secondary | ICD-10-CM | POA: Diagnosis not present

## 2018-11-10 DIAGNOSIS — R740 Nonspecific elevation of levels of transaminase and lactic acid dehydrogenase [LDH]: Secondary | ICD-10-CM | POA: Diagnosis not present

## 2018-11-10 DIAGNOSIS — I10 Essential (primary) hypertension: Secondary | ICD-10-CM | POA: Diagnosis not present

## 2018-11-10 DIAGNOSIS — K760 Fatty (change of) liver, not elsewhere classified: Secondary | ICD-10-CM | POA: Diagnosis not present

## 2018-11-10 DIAGNOSIS — R739 Hyperglycemia, unspecified: Secondary | ICD-10-CM | POA: Diagnosis not present

## 2019-04-23 DIAGNOSIS — E7800 Pure hypercholesterolemia, unspecified: Secondary | ICD-10-CM | POA: Diagnosis not present

## 2019-04-23 DIAGNOSIS — R740 Nonspecific elevation of levels of transaminase and lactic acid dehydrogenase [LDH]: Secondary | ICD-10-CM | POA: Diagnosis not present

## 2019-04-23 DIAGNOSIS — K760 Fatty (change of) liver, not elsewhere classified: Secondary | ICD-10-CM | POA: Diagnosis not present

## 2019-04-23 DIAGNOSIS — R739 Hyperglycemia, unspecified: Secondary | ICD-10-CM | POA: Diagnosis not present

## 2019-04-23 DIAGNOSIS — I10 Essential (primary) hypertension: Secondary | ICD-10-CM | POA: Diagnosis not present

## 2019-04-23 DIAGNOSIS — E669 Obesity, unspecified: Secondary | ICD-10-CM | POA: Diagnosis not present

## 2019-05-07 DIAGNOSIS — Z20828 Contact with and (suspected) exposure to other viral communicable diseases: Secondary | ICD-10-CM | POA: Diagnosis not present

## 2019-09-01 DIAGNOSIS — H25813 Combined forms of age-related cataract, bilateral: Secondary | ICD-10-CM | POA: Diagnosis not present

## 2019-09-01 DIAGNOSIS — H0288A Meibomian gland dysfunction right eye, upper and lower eyelids: Secondary | ICD-10-CM | POA: Diagnosis not present

## 2019-09-01 DIAGNOSIS — H02822 Cysts of right lower eyelid: Secondary | ICD-10-CM | POA: Diagnosis not present

## 2019-09-01 DIAGNOSIS — H02831 Dermatochalasis of right upper eyelid: Secondary | ICD-10-CM | POA: Diagnosis not present

## 2019-09-01 DIAGNOSIS — H0102B Squamous blepharitis left eye, upper and lower eyelids: Secondary | ICD-10-CM | POA: Diagnosis not present

## 2019-09-01 DIAGNOSIS — H16143 Punctate keratitis, bilateral: Secondary | ICD-10-CM | POA: Diagnosis not present

## 2019-09-01 DIAGNOSIS — H02834 Dermatochalasis of left upper eyelid: Secondary | ICD-10-CM | POA: Diagnosis not present

## 2019-09-01 DIAGNOSIS — H0288B Meibomian gland dysfunction left eye, upper and lower eyelids: Secondary | ICD-10-CM | POA: Diagnosis not present

## 2019-09-01 DIAGNOSIS — H0102A Squamous blepharitis right eye, upper and lower eyelids: Secondary | ICD-10-CM | POA: Diagnosis not present

## 2019-09-01 DIAGNOSIS — H4322 Crystalline deposits in vitreous body, left eye: Secondary | ICD-10-CM | POA: Diagnosis not present

## 2019-12-24 DIAGNOSIS — E7800 Pure hypercholesterolemia, unspecified: Secondary | ICD-10-CM | POA: Diagnosis not present

## 2019-12-24 DIAGNOSIS — I10 Essential (primary) hypertension: Secondary | ICD-10-CM | POA: Diagnosis not present

## 2019-12-24 DIAGNOSIS — M1711 Unilateral primary osteoarthritis, right knee: Secondary | ICD-10-CM | POA: Diagnosis not present

## 2019-12-27 DIAGNOSIS — I10 Essential (primary) hypertension: Secondary | ICD-10-CM | POA: Diagnosis not present

## 2019-12-27 DIAGNOSIS — E7800 Pure hypercholesterolemia, unspecified: Secondary | ICD-10-CM | POA: Diagnosis not present

## 2019-12-27 DIAGNOSIS — E669 Obesity, unspecified: Secondary | ICD-10-CM | POA: Diagnosis not present

## 2019-12-27 DIAGNOSIS — K760 Fatty (change of) liver, not elsewhere classified: Secondary | ICD-10-CM | POA: Diagnosis not present

## 2019-12-27 DIAGNOSIS — Z20822 Contact with and (suspected) exposure to covid-19: Secondary | ICD-10-CM | POA: Diagnosis not present

## 2019-12-27 DIAGNOSIS — R739 Hyperglycemia, unspecified: Secondary | ICD-10-CM | POA: Diagnosis not present

## 2019-12-28 DIAGNOSIS — Z20822 Contact with and (suspected) exposure to covid-19: Secondary | ICD-10-CM | POA: Diagnosis not present

## 2020-02-24 DIAGNOSIS — E7800 Pure hypercholesterolemia, unspecified: Secondary | ICD-10-CM | POA: Diagnosis not present

## 2020-02-24 DIAGNOSIS — M1711 Unilateral primary osteoarthritis, right knee: Secondary | ICD-10-CM | POA: Diagnosis not present

## 2020-02-24 DIAGNOSIS — I10 Essential (primary) hypertension: Secondary | ICD-10-CM | POA: Diagnosis not present

## 2020-03-06 DIAGNOSIS — I10 Essential (primary) hypertension: Secondary | ICD-10-CM | POA: Diagnosis not present

## 2020-03-06 DIAGNOSIS — M1711 Unilateral primary osteoarthritis, right knee: Secondary | ICD-10-CM | POA: Diagnosis not present

## 2020-03-06 DIAGNOSIS — E7800 Pure hypercholesterolemia, unspecified: Secondary | ICD-10-CM | POA: Diagnosis not present

## 2020-03-07 DIAGNOSIS — Z20822 Contact with and (suspected) exposure to covid-19: Secondary | ICD-10-CM | POA: Diagnosis not present

## 2020-03-07 DIAGNOSIS — R739 Hyperglycemia, unspecified: Secondary | ICD-10-CM | POA: Diagnosis not present

## 2020-03-07 DIAGNOSIS — K760 Fatty (change of) liver, not elsewhere classified: Secondary | ICD-10-CM | POA: Diagnosis not present

## 2020-03-07 DIAGNOSIS — I10 Essential (primary) hypertension: Secondary | ICD-10-CM | POA: Diagnosis not present

## 2020-03-07 DIAGNOSIS — E7800 Pure hypercholesterolemia, unspecified: Secondary | ICD-10-CM | POA: Diagnosis not present

## 2020-03-07 DIAGNOSIS — E669 Obesity, unspecified: Secondary | ICD-10-CM | POA: Diagnosis not present

## 2020-05-11 DIAGNOSIS — E7800 Pure hypercholesterolemia, unspecified: Secondary | ICD-10-CM | POA: Diagnosis not present

## 2020-05-11 DIAGNOSIS — I10 Essential (primary) hypertension: Secondary | ICD-10-CM | POA: Diagnosis not present

## 2020-05-11 DIAGNOSIS — M1711 Unilateral primary osteoarthritis, right knee: Secondary | ICD-10-CM | POA: Diagnosis not present

## 2020-05-15 DIAGNOSIS — Z20822 Contact with and (suspected) exposure to covid-19: Secondary | ICD-10-CM | POA: Diagnosis not present

## 2020-05-15 DIAGNOSIS — S40212A Abrasion of left shoulder, initial encounter: Secondary | ICD-10-CM | POA: Diagnosis not present

## 2020-05-15 DIAGNOSIS — R402141 Coma scale, eyes open, spontaneous, in the field [EMT or ambulance]: Secondary | ICD-10-CM | POA: Diagnosis present

## 2020-05-15 DIAGNOSIS — R402361 Coma scale, best motor response, obeys commands, in the field [EMT or ambulance]: Secondary | ICD-10-CM | POA: Diagnosis present

## 2020-05-15 DIAGNOSIS — S065X1A Traumatic subdural hemorrhage with loss of consciousness of 30 minutes or less, initial encounter: Secondary | ICD-10-CM | POA: Diagnosis present

## 2020-05-15 DIAGNOSIS — F17290 Nicotine dependence, other tobacco product, uncomplicated: Secondary | ICD-10-CM | POA: Diagnosis present

## 2020-05-15 DIAGNOSIS — S199XXA Unspecified injury of neck, initial encounter: Secondary | ICD-10-CM | POA: Diagnosis not present

## 2020-05-15 DIAGNOSIS — S066X9A Traumatic subarachnoid hemorrhage with loss of consciousness of unspecified duration, initial encounter: Secondary | ICD-10-CM | POA: Diagnosis not present

## 2020-05-15 DIAGNOSIS — W19XXXA Unspecified fall, initial encounter: Secondary | ICD-10-CM | POA: Diagnosis not present

## 2020-05-15 DIAGNOSIS — M542 Cervicalgia: Secondary | ICD-10-CM | POA: Diagnosis not present

## 2020-05-15 DIAGNOSIS — E785 Hyperlipidemia, unspecified: Secondary | ICD-10-CM | POA: Diagnosis present

## 2020-05-15 DIAGNOSIS — R40241 Glasgow coma scale score 13-15, unspecified time: Secondary | ICD-10-CM | POA: Diagnosis not present

## 2020-05-15 DIAGNOSIS — I10 Essential (primary) hypertension: Secondary | ICD-10-CM | POA: Diagnosis present

## 2020-05-15 DIAGNOSIS — S0181XA Laceration without foreign body of other part of head, initial encounter: Secondary | ICD-10-CM | POA: Diagnosis not present

## 2020-05-15 DIAGNOSIS — Z880 Allergy status to penicillin: Secondary | ICD-10-CM | POA: Diagnosis not present

## 2020-05-15 DIAGNOSIS — R42 Dizziness and giddiness: Secondary | ICD-10-CM | POA: Diagnosis not present

## 2020-05-15 DIAGNOSIS — R402251 Coma scale, best verbal response, oriented, in the field [EMT or ambulance]: Secondary | ICD-10-CM | POA: Diagnosis present

## 2020-05-15 DIAGNOSIS — S065X9A Traumatic subdural hemorrhage with loss of consciousness of unspecified duration, initial encounter: Secondary | ICD-10-CM | POA: Diagnosis not present

## 2020-05-15 DIAGNOSIS — R58 Hemorrhage, not elsewhere classified: Secondary | ICD-10-CM | POA: Diagnosis not present

## 2020-05-15 DIAGNOSIS — S066X1A Traumatic subarachnoid hemorrhage with loss of consciousness of 30 minutes or less, initial encounter: Secondary | ICD-10-CM | POA: Diagnosis not present

## 2020-05-15 DIAGNOSIS — S06369A Traumatic hemorrhage of cerebrum, unspecified, with loss of consciousness of unspecified duration, initial encounter: Secondary | ICD-10-CM | POA: Diagnosis not present

## 2020-05-15 DIAGNOSIS — I609 Nontraumatic subarachnoid hemorrhage, unspecified: Secondary | ICD-10-CM | POA: Diagnosis not present

## 2020-05-15 DIAGNOSIS — R52 Pain, unspecified: Secondary | ICD-10-CM | POA: Diagnosis not present

## 2020-05-15 DIAGNOSIS — S065X0A Traumatic subdural hemorrhage without loss of consciousness, initial encounter: Secondary | ICD-10-CM | POA: Diagnosis not present

## 2020-05-15 DIAGNOSIS — S066X0A Traumatic subarachnoid hemorrhage without loss of consciousness, initial encounter: Secondary | ICD-10-CM | POA: Diagnosis not present

## 2020-05-15 DIAGNOSIS — S0101XA Laceration without foreign body of scalp, initial encounter: Secondary | ICD-10-CM | POA: Diagnosis not present

## 2020-05-15 DIAGNOSIS — R0902 Hypoxemia: Secondary | ICD-10-CM | POA: Diagnosis not present

## 2020-05-25 DIAGNOSIS — S0101XD Laceration without foreign body of scalp, subsequent encounter: Secondary | ICD-10-CM | POA: Diagnosis not present

## 2020-05-25 DIAGNOSIS — S069X1D Unspecified intracranial injury with loss of consciousness of 30 minutes or less, subsequent encounter: Secondary | ICD-10-CM | POA: Diagnosis not present

## 2020-05-25 DIAGNOSIS — I10 Essential (primary) hypertension: Secondary | ICD-10-CM | POA: Diagnosis not present

## 2020-05-25 DIAGNOSIS — E669 Obesity, unspecified: Secondary | ICD-10-CM | POA: Diagnosis not present

## 2020-05-25 DIAGNOSIS — Z09 Encounter for follow-up examination after completed treatment for conditions other than malignant neoplasm: Secondary | ICD-10-CM | POA: Diagnosis not present

## 2020-05-25 DIAGNOSIS — E7800 Pure hypercholesterolemia, unspecified: Secondary | ICD-10-CM | POA: Diagnosis not present

## 2020-06-13 DIAGNOSIS — E7800 Pure hypercholesterolemia, unspecified: Secondary | ICD-10-CM | POA: Diagnosis not present

## 2020-06-13 DIAGNOSIS — I10 Essential (primary) hypertension: Secondary | ICD-10-CM | POA: Diagnosis not present

## 2020-06-13 DIAGNOSIS — M1711 Unilateral primary osteoarthritis, right knee: Secondary | ICD-10-CM | POA: Diagnosis not present

## 2020-06-15 DIAGNOSIS — S0990XD Unspecified injury of head, subsequent encounter: Secondary | ICD-10-CM | POA: Diagnosis not present

## 2020-06-22 DIAGNOSIS — E7800 Pure hypercholesterolemia, unspecified: Secondary | ICD-10-CM | POA: Diagnosis not present

## 2020-06-22 DIAGNOSIS — I10 Essential (primary) hypertension: Secondary | ICD-10-CM | POA: Diagnosis not present

## 2020-06-22 DIAGNOSIS — S069X1S Unspecified intracranial injury with loss of consciousness of 30 minutes or less, sequela: Secondary | ICD-10-CM | POA: Diagnosis not present

## 2020-06-22 DIAGNOSIS — E669 Obesity, unspecified: Secondary | ICD-10-CM | POA: Diagnosis not present

## 2020-08-12 ENCOUNTER — Ambulatory Visit: Payer: Self-pay | Attending: Internal Medicine

## 2020-08-12 DIAGNOSIS — Z23 Encounter for immunization: Secondary | ICD-10-CM

## 2020-08-12 NOTE — Progress Notes (Signed)
   Covid-19 Vaccination Clinic  Name:  Brady Castaneda    MRN: [id number] DOB: 1946-09-14  08/12/2020  [name] was observed post Covid-19 immunization for 15 minutes without incident. He was provided with Vaccine Information Sheet and instruction to access the V-Safe system.   [name] was instructed to call 911 with any severe reactions post vaccine: Marland Kitchen Difficulty breathing  . Swelling of face and throat  . A fast heartbeat  . A bad rash all over body  . Dizziness and weakness

## 2020-09-04 DIAGNOSIS — H0288B Meibomian gland dysfunction left eye, upper and lower eyelids: Secondary | ICD-10-CM | POA: Diagnosis not present

## 2020-09-04 DIAGNOSIS — H02822 Cysts of right lower eyelid: Secondary | ICD-10-CM | POA: Diagnosis not present

## 2020-09-04 DIAGNOSIS — H02831 Dermatochalasis of right upper eyelid: Secondary | ICD-10-CM | POA: Diagnosis not present

## 2020-09-04 DIAGNOSIS — H0102A Squamous blepharitis right eye, upper and lower eyelids: Secondary | ICD-10-CM | POA: Diagnosis not present

## 2020-09-04 DIAGNOSIS — H04123 Dry eye syndrome of bilateral lacrimal glands: Secondary | ICD-10-CM | POA: Diagnosis not present

## 2020-09-04 DIAGNOSIS — H25813 Combined forms of age-related cataract, bilateral: Secondary | ICD-10-CM | POA: Diagnosis not present

## 2020-09-04 DIAGNOSIS — H02834 Dermatochalasis of left upper eyelid: Secondary | ICD-10-CM | POA: Diagnosis not present

## 2020-09-04 DIAGNOSIS — H4322 Crystalline deposits in vitreous body, left eye: Secondary | ICD-10-CM | POA: Diagnosis not present

## 2020-09-04 DIAGNOSIS — H0288A Meibomian gland dysfunction right eye, upper and lower eyelids: Secondary | ICD-10-CM | POA: Diagnosis not present

## 2020-09-04 DIAGNOSIS — H0102B Squamous blepharitis left eye, upper and lower eyelids: Secondary | ICD-10-CM | POA: Diagnosis not present

## 2020-09-14 DIAGNOSIS — E7800 Pure hypercholesterolemia, unspecified: Secondary | ICD-10-CM | POA: Diagnosis not present

## 2020-09-14 DIAGNOSIS — I10 Essential (primary) hypertension: Secondary | ICD-10-CM | POA: Diagnosis not present

## 2020-09-14 DIAGNOSIS — M1711 Unilateral primary osteoarthritis, right knee: Secondary | ICD-10-CM | POA: Diagnosis not present

## 2020-09-26 DIAGNOSIS — Z6839 Body mass index (BMI) 39.0-39.9, adult: Secondary | ICD-10-CM | POA: Diagnosis not present

## 2020-09-26 DIAGNOSIS — K760 Fatty (change of) liver, not elsewhere classified: Secondary | ICD-10-CM | POA: Diagnosis not present

## 2020-09-26 DIAGNOSIS — I10 Essential (primary) hypertension: Secondary | ICD-10-CM | POA: Diagnosis not present

## 2020-09-26 DIAGNOSIS — R739 Hyperglycemia, unspecified: Secondary | ICD-10-CM | POA: Diagnosis not present

## 2020-09-26 DIAGNOSIS — E7800 Pure hypercholesterolemia, unspecified: Secondary | ICD-10-CM | POA: Diagnosis not present

## 2020-10-31 DIAGNOSIS — I10 Essential (primary) hypertension: Secondary | ICD-10-CM | POA: Diagnosis not present

## 2020-10-31 DIAGNOSIS — E7800 Pure hypercholesterolemia, unspecified: Secondary | ICD-10-CM | POA: Diagnosis not present

## 2020-10-31 DIAGNOSIS — M1711 Unilateral primary osteoarthritis, right knee: Secondary | ICD-10-CM | POA: Diagnosis not present

## 2021-03-27 DIAGNOSIS — E7800 Pure hypercholesterolemia, unspecified: Secondary | ICD-10-CM | POA: Diagnosis not present

## 2021-03-27 DIAGNOSIS — I10 Essential (primary) hypertension: Secondary | ICD-10-CM | POA: Diagnosis not present

## 2021-03-27 DIAGNOSIS — M1711 Unilateral primary osteoarthritis, right knee: Secondary | ICD-10-CM | POA: Diagnosis not present

## 2021-03-29 DIAGNOSIS — I10 Essential (primary) hypertension: Secondary | ICD-10-CM | POA: Diagnosis not present

## 2021-03-29 DIAGNOSIS — K760 Fatty (change of) liver, not elsewhere classified: Secondary | ICD-10-CM | POA: Diagnosis not present

## 2021-03-29 DIAGNOSIS — Z1211 Encounter for screening for malignant neoplasm of colon: Secondary | ICD-10-CM | POA: Diagnosis not present

## 2021-03-29 DIAGNOSIS — Z6839 Body mass index (BMI) 39.0-39.9, adult: Secondary | ICD-10-CM | POA: Diagnosis not present

## 2021-03-29 DIAGNOSIS — R739 Hyperglycemia, unspecified: Secondary | ICD-10-CM | POA: Diagnosis not present

## 2021-03-29 DIAGNOSIS — E7800 Pure hypercholesterolemia, unspecified: Secondary | ICD-10-CM | POA: Diagnosis not present

## 2021-04-24 DIAGNOSIS — E785 Hyperlipidemia, unspecified: Secondary | ICD-10-CM | POA: Diagnosis not present

## 2021-04-24 DIAGNOSIS — I10 Essential (primary) hypertension: Secondary | ICD-10-CM | POA: Diagnosis not present

## 2021-04-24 DIAGNOSIS — M1711 Unilateral primary osteoarthritis, right knee: Secondary | ICD-10-CM | POA: Diagnosis not present

## 2021-04-24 DIAGNOSIS — E7800 Pure hypercholesterolemia, unspecified: Secondary | ICD-10-CM | POA: Diagnosis not present

## 2021-05-09 DIAGNOSIS — Z23 Encounter for immunization: Secondary | ICD-10-CM | POA: Diagnosis not present

## 2021-05-16 DIAGNOSIS — I10 Essential (primary) hypertension: Secondary | ICD-10-CM | POA: Diagnosis not present

## 2021-05-16 DIAGNOSIS — E785 Hyperlipidemia, unspecified: Secondary | ICD-10-CM | POA: Diagnosis not present

## 2021-05-16 DIAGNOSIS — M1711 Unilateral primary osteoarthritis, right knee: Secondary | ICD-10-CM | POA: Diagnosis not present

## 2021-05-16 DIAGNOSIS — E7800 Pure hypercholesterolemia, unspecified: Secondary | ICD-10-CM | POA: Diagnosis not present

## 2021-07-27 DIAGNOSIS — E7800 Pure hypercholesterolemia, unspecified: Secondary | ICD-10-CM | POA: Diagnosis not present

## 2021-07-27 DIAGNOSIS — I10 Essential (primary) hypertension: Secondary | ICD-10-CM | POA: Diagnosis not present

## 2021-07-27 DIAGNOSIS — E785 Hyperlipidemia, unspecified: Secondary | ICD-10-CM | POA: Diagnosis not present

## 2021-07-27 DIAGNOSIS — M1711 Unilateral primary osteoarthritis, right knee: Secondary | ICD-10-CM | POA: Diagnosis not present

## 2021-08-22 DIAGNOSIS — Z23 Encounter for immunization: Secondary | ICD-10-CM | POA: Diagnosis not present

## 2021-09-26 DIAGNOSIS — Z0000 Encounter for general adult medical examination without abnormal findings: Secondary | ICD-10-CM | POA: Diagnosis not present

## 2021-09-26 DIAGNOSIS — Z1389 Encounter for screening for other disorder: Secondary | ICD-10-CM | POA: Diagnosis not present

## 2021-10-01 DIAGNOSIS — Z125 Encounter for screening for malignant neoplasm of prostate: Secondary | ICD-10-CM | POA: Diagnosis not present

## 2021-10-01 DIAGNOSIS — Z1211 Encounter for screening for malignant neoplasm of colon: Secondary | ICD-10-CM | POA: Diagnosis not present

## 2021-10-01 DIAGNOSIS — R739 Hyperglycemia, unspecified: Secondary | ICD-10-CM | POA: Diagnosis not present

## 2021-10-01 DIAGNOSIS — I10 Essential (primary) hypertension: Secondary | ICD-10-CM | POA: Diagnosis not present

## 2021-10-01 DIAGNOSIS — E7800 Pure hypercholesterolemia, unspecified: Secondary | ICD-10-CM | POA: Diagnosis not present

## 2021-10-01 DIAGNOSIS — K760 Fatty (change of) liver, not elsewhere classified: Secondary | ICD-10-CM | POA: Diagnosis not present

## 2021-10-03 DIAGNOSIS — Z125 Encounter for screening for malignant neoplasm of prostate: Secondary | ICD-10-CM | POA: Diagnosis not present

## 2021-10-03 DIAGNOSIS — R739 Hyperglycemia, unspecified: Secondary | ICD-10-CM | POA: Diagnosis not present

## 2021-10-03 DIAGNOSIS — E7800 Pure hypercholesterolemia, unspecified: Secondary | ICD-10-CM | POA: Diagnosis not present

## 2022-07-11 ENCOUNTER — Other Ambulatory Visit (HOSPITAL_BASED_OUTPATIENT_CLINIC_OR_DEPARTMENT_OTHER): Payer: Self-pay

## 2022-07-11 ENCOUNTER — Encounter (HOSPITAL_BASED_OUTPATIENT_CLINIC_OR_DEPARTMENT_OTHER): Payer: Self-pay

## 2022-07-11 ENCOUNTER — Emergency Department (HOSPITAL_BASED_OUTPATIENT_CLINIC_OR_DEPARTMENT_OTHER)
Admission: EM | Admit: 2022-07-11 | Discharge: 2022-07-11 | Disposition: A | Discharge status: Home / Self Care | Payer: PPO | Attending: Emergency Medicine | Admitting: Emergency Medicine

## 2022-07-11 ENCOUNTER — Other Ambulatory Visit: Payer: Self-pay

## 2022-07-11 DIAGNOSIS — K047 Periapical abscess without sinus: Secondary | ICD-10-CM | POA: Insufficient documentation

## 2022-07-11 DIAGNOSIS — T7840XA Allergy, unspecified, initial encounter: Secondary | ICD-10-CM | POA: Diagnosis present

## 2022-07-11 LAB — CBC WITH DIFFERENTIAL/PLATELET
Abs Immature Granulocytes: 0.03 10*3/uL (ref 0.00–0.07)
Basophils Absolute: 0 10*3/uL (ref 0.0–0.1)
Basophils Relative: 0 %
Eosinophils Absolute: 0.5 10*3/uL (ref 0.0–0.5)
Eosinophils Relative: 5 %
HCT: 41.2 % (ref 39.0–52.0)
Hemoglobin: 14.1 g/dL (ref 13.0–17.0)
Immature Granulocytes: 0 %
Lymphocytes Relative: 8 %
Lymphs Abs: 1 10*3/uL (ref 0.7–4.0)
MCH: 32.3 pg (ref 26.0–34.0)
MCHC: 34.2 g/dL (ref 30.0–36.0)
MCV: 94.5 fL (ref 80.0–100.0)
Monocytes Absolute: 0.6 10*3/uL (ref 0.1–1.0)
Monocytes Relative: 5 %
Neutro Abs: 9.7 10*3/uL — ABNORMAL HIGH (ref 1.7–7.7)
Neutrophils Relative %: 82 %
Platelets: 180 10*3/uL (ref 150–400)
RBC: 4.36 MIL/uL (ref 4.22–5.81)
RDW: 13 % (ref 11.5–15.5)
WBC: 11.8 10*3/uL — ABNORMAL HIGH (ref 4.0–10.5)
nRBC: 0 % (ref 0.0–0.2)

## 2022-07-11 LAB — BASIC METABOLIC PANEL
Anion gap: 9 (ref 5–15)
BUN: 25 mg/dL — ABNORMAL HIGH (ref 8–23)
CO2: 26 mmol/L (ref 22–32)
Calcium: 8.7 mg/dL — ABNORMAL LOW (ref 8.9–10.3)
Chloride: 99 mmol/L (ref 98–111)
Creatinine, Ser: 2.08 mg/dL — ABNORMAL HIGH (ref 0.61–1.24)
GFR, Estimated: 33 mL/min — ABNORMAL LOW (ref >60)
Glucose, Bld: 119 mg/dL — ABNORMAL HIGH (ref 70–99)
Potassium: 3.8 mmol/L (ref 3.5–5.1)
Sodium: 134 mmol/L — ABNORMAL LOW (ref 135–145)

## 2022-07-11 MED ORDER — SODIUM CHLORIDE 0.9 % IV BOLUS
1000.0000 mL | Freq: Once | INTRAVENOUS | Status: AC
  Administered 2022-07-11: 1000 mL via INTRAVENOUS

## 2022-07-11 MED ORDER — METHYLPREDNISOLONE SODIUM SUCC 125 MG IJ SOLR
125.0000 mg | Freq: Once | INTRAMUSCULAR | Status: AC
  Administered 2022-07-11: 125 mg via INTRAVENOUS
  Filled 2022-07-11: qty 2

## 2022-07-11 MED ORDER — PREDNISONE 10 MG PO TABS
40.0000 mg | ORAL_TABLET | Freq: Every day | ORAL | 0 refills | Status: AC
  Filled 2022-07-11: qty 8, 2d supply, fill #0

## 2022-07-11 NOTE — Discharge Instructions (Addendum)
The prednisone should hopefully help with the reaction.  Zyrtec can also help.  Follow-up with your doctor for further evaluation.

## 2022-07-11 NOTE — ED Provider Notes (Signed)
[facility] DEPT Provider Note   CSN: [id number] Arrival date & time: 07/11/22  1529     History  Chief Complaint  Patient presents with   Allergic Reaction    Brady Castaneda is a 76 y.o. male.   Allergic Reaction Patient presents with rash/allergic reaction.  Was on clindamycin for dental pain.  Has not knowingly had a before.  Took around 6 doses of medicine then developed a diffuse rash.  Began chest but is gone diffusely.  Had stopped the clindamycin and then a day or 2 later started azithromycin.  Has had a few days of that with continued rash.  Itchy.  Covers most of his body.  Not involving the mouth.  Feels fatigued.  States he has trouble sleeping due to the itchiness.  Went to see PCP today and had pressures in the 80s and sent in here.  Has been taking Benadryl also.  States he has been sleepy.  States he does react to poison ivy severely.     Home Medications Prior to Admission medications   Medication Sig Start Date End Date Taking? Authorizing Provider  predniSONE (DELTASONE) 10 MG tablet Take 4 tablets (40 mg total) by mouth daily. 07/11/22  Yes Davonna Belling, MD  azelastine (OPTIVAR) 0.05 % ophthalmic solution Place 1 drop into both eyes 2 (two) times daily as needed. 10/25/14   Wendie Agreste, MD  ciprofloxacin (CILOXAN) 0.3 % ophthalmic solution Administer 1 drop to affected eye every 2 hours, while awake, for 2 days. Then 1 drop, every 4 hours, while awake, for the next 5 days. 10/25/14   Wendie Agreste, MD      Allergies    Penicillins    Review of Systems   Review of Systems  Physical Exam Updated Vital Signs BP (!) 129/55   Pulse 79   Temp 97.8 F (36.6 C) (Oral)   Resp 20   Ht 6' (1.829 m)   Wt 126.4 kg   SpO2 96%   BMI 37.79 kg/m  Physical Exam Vitals and nursing note reviewed.  HENT:     Mouth/Throat:     Pharynx: No oropharyngeal exudate or posterior oropharyngeal erythema.  Pulmonary:     Breath  sounds: No rhonchi.  Chest:     Chest wall: No tenderness.  Abdominal:     Tenderness: There is no abdominal tenderness.  Musculoskeletal:     Cervical back: Neck supple.  Skin:    Findings: Rash present.     Comments: Diffuse hives over much of the body.  Chest and extremities.  Somewhat confluent.  Some erythema.  No mucous membrane involvement.  Neurological:     Mental Status: He is alert.     ED Results / Procedures / Treatments   Labs (all labs ordered are listed, but only abnormal results are displayed) Labs Reviewed  CBC WITH DIFFERENTIAL/PLATELET - Abnormal; Notable for the following components:      Result Value   WBC 11.8 (*)    Neutro Abs 9.7 (*)    All other components within normal limits  BASIC METABOLIC PANEL - Abnormal; Notable for the following components:   Sodium 134 (*)    Glucose, Bld 119 (*)    BUN 25 (*)    Creatinine, Ser 2.08 (*)    Calcium 8.7 (*)    GFR, Estimated 33 (*)    All other components within normal limits    EKG None  Radiology No results found.  Procedures  Procedures    Medications Ordered in ED Medications  sodium chloride 0.9 % bolus 1,000 mL (0 mLs Intravenous Stopped 07/11/22 1851)  methylPREDNISolone sodium succinate (SOLU-MEDROL) 125 mg/2 mL injection 125 mg (125 mg Intravenous Given 07/11/22 1628)    ED Course/ Medical Decision Making/ A&P                           Medical Decision Making Amount and/or Complexity of Data Reviewed Labs: ordered.  Risk Prescription drug management.   Patient with rash.  Has been on antibiotics for dental infection.  Had Benadryl at home.  Also found to be hypotensive.  No mucous membrane involvement.  Feels somewhat fatigued.  Sepsis felt less likely.  Has had symptoms for around 4 days now and acute anaphylaxis felt less likely.  We will give fluid bolus and check some basic blood work.  We will give some steroids.  Will stop antibiotics at this point since dental pain is improved  and had a total of 5 days of antibiotics  Patient has had his blood pressure improved after fluid bolus.  Creatinine is mildly increased.  Unsure baseline since last creatinine I can see was about 10 years ago.  With blood pressure improved I think he can be followed up as an outpatient.  We will give some steroids.  Discharge home.       Final Clinical Impression(s) / ED Diagnoses Final diagnoses:  Allergic reaction, initial encounter    Rx / DC Orders ED Discharge Orders          Ordered    predniSONE (DELTASONE) 10 MG tablet  Daily        07/11/22 1944              Davonna Belling, MD 07/11/22 1949

## 2022-07-11 NOTE — ED Triage Notes (Signed)
Patient here POV from Home.  Noted Rash that Began Sunday and has Progressed since.   Seen by Dentist on Friday and given Clindamycin. Took Doses on Friday and Saturday and One Dose on Sunday which is when he noted the rash. Switched to Azithromycin.   Seen by PCP Today and sent for Evaluation.   Benadryl at 1200 Today.   NAD Noted during Triage. A&Ox4. GCS 15. Ambulatory.

## 2022-07-12 ENCOUNTER — Other Ambulatory Visit (HOSPITAL_BASED_OUTPATIENT_CLINIC_OR_DEPARTMENT_OTHER): Payer: Self-pay

## 2023-04-17 DIAGNOSIS — I10 Essential (primary) hypertension: Secondary | ICD-10-CM | POA: Diagnosis not present

## 2023-04-17 DIAGNOSIS — E7800 Pure hypercholesterolemia, unspecified: Secondary | ICD-10-CM | POA: Diagnosis not present

## 2023-04-17 DIAGNOSIS — Z6841 Body Mass Index (BMI) 40.0 and over, adult: Secondary | ICD-10-CM | POA: Diagnosis not present

## 2023-04-17 DIAGNOSIS — E559 Vitamin D deficiency, unspecified: Secondary | ICD-10-CM | POA: Diagnosis not present

## 2023-10-08 DIAGNOSIS — I10 Essential (primary) hypertension: Secondary | ICD-10-CM | POA: Diagnosis not present

## 2023-10-08 DIAGNOSIS — Z0000 Encounter for general adult medical examination without abnormal findings: Secondary | ICD-10-CM | POA: Diagnosis not present

## 2023-10-08 DIAGNOSIS — Z23 Encounter for immunization: Secondary | ICD-10-CM | POA: Diagnosis not present

## 2023-10-08 DIAGNOSIS — R739 Hyperglycemia, unspecified: Secondary | ICD-10-CM | POA: Diagnosis not present

## 2023-10-08 DIAGNOSIS — E7800 Pure hypercholesterolemia, unspecified: Secondary | ICD-10-CM | POA: Diagnosis not present

## 2024-03-23 DIAGNOSIS — I10 Essential (primary) hypertension: Secondary | ICD-10-CM | POA: Diagnosis not present

## 2024-03-27 DIAGNOSIS — E785 Hyperlipidemia, unspecified: Secondary | ICD-10-CM | POA: Diagnosis not present

## 2024-03-27 DIAGNOSIS — I10 Essential (primary) hypertension: Secondary | ICD-10-CM | POA: Diagnosis not present

## 2024-03-27 DIAGNOSIS — E7800 Pure hypercholesterolemia, unspecified: Secondary | ICD-10-CM | POA: Diagnosis not present

## 2024-04-15 DIAGNOSIS — I10 Essential (primary) hypertension: Secondary | ICD-10-CM | POA: Diagnosis not present

## 2024-04-15 DIAGNOSIS — Z6839 Body mass index (BMI) 39.0-39.9, adult: Secondary | ICD-10-CM | POA: Diagnosis not present

## 2024-04-15 DIAGNOSIS — R7401 Elevation of levels of liver transaminase levels: Secondary | ICD-10-CM | POA: Diagnosis not present

## 2024-04-15 DIAGNOSIS — E785 Hyperlipidemia, unspecified: Secondary | ICD-10-CM | POA: Diagnosis not present

## 2024-04-15 DIAGNOSIS — E7800 Pure hypercholesterolemia, unspecified: Secondary | ICD-10-CM | POA: Diagnosis not present

## 2024-04-15 DIAGNOSIS — E673 Hypervitaminosis D: Secondary | ICD-10-CM | POA: Diagnosis not present

## 2024-04-21 DIAGNOSIS — I10 Essential (primary) hypertension: Secondary | ICD-10-CM | POA: Diagnosis not present

## 2024-04-26 DIAGNOSIS — I10 Essential (primary) hypertension: Secondary | ICD-10-CM | POA: Diagnosis not present

## 2024-04-26 DIAGNOSIS — E7800 Pure hypercholesterolemia, unspecified: Secondary | ICD-10-CM | POA: Diagnosis not present

## 2024-04-26 DIAGNOSIS — E785 Hyperlipidemia, unspecified: Secondary | ICD-10-CM | POA: Diagnosis not present

## 2024-05-21 DIAGNOSIS — I10 Essential (primary) hypertension: Secondary | ICD-10-CM | POA: Diagnosis not present

## 2024-05-27 DIAGNOSIS — I10 Essential (primary) hypertension: Secondary | ICD-10-CM | POA: Diagnosis not present

## 2024-05-27 DIAGNOSIS — E785 Hyperlipidemia, unspecified: Secondary | ICD-10-CM | POA: Diagnosis not present

## 2024-05-27 DIAGNOSIS — E7800 Pure hypercholesterolemia, unspecified: Secondary | ICD-10-CM | POA: Diagnosis not present

## 2024-06-20 DIAGNOSIS — I10 Essential (primary) hypertension: Secondary | ICD-10-CM | POA: Diagnosis not present

## 2024-06-27 DIAGNOSIS — E785 Hyperlipidemia, unspecified: Secondary | ICD-10-CM | POA: Diagnosis not present

## 2024-06-27 DIAGNOSIS — E7800 Pure hypercholesterolemia, unspecified: Secondary | ICD-10-CM | POA: Diagnosis not present

## 2024-06-27 DIAGNOSIS — I10 Essential (primary) hypertension: Secondary | ICD-10-CM | POA: Diagnosis not present

## 2024-07-20 DIAGNOSIS — I10 Essential (primary) hypertension: Secondary | ICD-10-CM | POA: Diagnosis not present

## 2024-07-27 DIAGNOSIS — E785 Hyperlipidemia, unspecified: Secondary | ICD-10-CM | POA: Diagnosis not present

## 2024-07-27 DIAGNOSIS — E7800 Pure hypercholesterolemia, unspecified: Secondary | ICD-10-CM | POA: Diagnosis not present

## 2024-07-27 DIAGNOSIS — I10 Essential (primary) hypertension: Secondary | ICD-10-CM | POA: Diagnosis not present

## 2024-08-19 DIAGNOSIS — I10 Essential (primary) hypertension: Secondary | ICD-10-CM | POA: Diagnosis not present

## 2024-08-27 DIAGNOSIS — I10 Essential (primary) hypertension: Secondary | ICD-10-CM | POA: Diagnosis not present

## 2024-08-27 DIAGNOSIS — E7800 Pure hypercholesterolemia, unspecified: Secondary | ICD-10-CM | POA: Diagnosis not present

## 2024-08-27 DIAGNOSIS — E785 Hyperlipidemia, unspecified: Secondary | ICD-10-CM | POA: Diagnosis not present

## 2024-09-18 DIAGNOSIS — I10 Essential (primary) hypertension: Secondary | ICD-10-CM | POA: Diagnosis not present

## 2024-09-26 DIAGNOSIS — I10 Essential (primary) hypertension: Secondary | ICD-10-CM | POA: Diagnosis not present

## 2024-09-26 DIAGNOSIS — E7800 Pure hypercholesterolemia, unspecified: Secondary | ICD-10-CM | POA: Diagnosis not present

## 2024-09-26 DIAGNOSIS — E785 Hyperlipidemia, unspecified: Secondary | ICD-10-CM | POA: Diagnosis not present
